# Patient Record
Sex: Male | Born: 1935 | Race: White | Hispanic: No | Smoking: Former smoker
Health system: Southern US, Community
[De-identification: ages and names within clinical notes are randomized; demographics above are authoritative.]

## PROBLEM LIST (undated history)

## (undated) DIAGNOSIS — F0390 Unspecified dementia without behavioral disturbance: Secondary | ICD-10-CM

## (undated) DIAGNOSIS — K219 Gastro-esophageal reflux disease without esophagitis: Secondary | ICD-10-CM

## (undated) DIAGNOSIS — F419 Anxiety disorder, unspecified: Secondary | ICD-10-CM

## (undated) DIAGNOSIS — E119 Type 2 diabetes mellitus without complications: Secondary | ICD-10-CM

## (undated) DIAGNOSIS — E7800 Pure hypercholesterolemia, unspecified: Secondary | ICD-10-CM

## (undated) DIAGNOSIS — J449 Chronic obstructive pulmonary disease, unspecified: Secondary | ICD-10-CM

## (undated) DIAGNOSIS — I10 Essential (primary) hypertension: Secondary | ICD-10-CM

## (undated) DIAGNOSIS — G459 Transient cerebral ischemic attack, unspecified: Secondary | ICD-10-CM

## (undated) HISTORY — PX: STOMACH SURGERY: SHX791

## (undated) HISTORY — PX: CATARACT EXTRACTION: SUR2

## (undated) HISTORY — PX: NECK SURGERY: SHX720

## (undated) HISTORY — DX: Unspecified dementia, unspecified severity, without behavioral disturbance, psychotic disturbance, mood disturbance, and anxiety: F03.90

## (undated) HISTORY — DX: Pure hypercholesterolemia, unspecified: E78.00

## (undated) HISTORY — DX: Anxiety disorder, unspecified: F41.9

## (undated) HISTORY — DX: Essential (primary) hypertension: I10

## (undated) HISTORY — DX: Chronic obstructive pulmonary disease, unspecified: J44.9

## (undated) HISTORY — DX: Gastro-esophageal reflux disease without esophagitis: K21.9

## (undated) HISTORY — DX: Transient cerebral ischemic attack, unspecified: G45.9

## (undated) HISTORY — PX: HERNIA REPAIR: SHX51

## (undated) HISTORY — DX: Type 2 diabetes mellitus without complications: E11.9

---

## 2010-05-10 ENCOUNTER — Encounter: Admission: RE | Admit: 2010-05-10 | Discharge: 2010-05-10 | Payer: Self-pay | Admitting: Neurosurgery

## 2010-05-18 ENCOUNTER — Inpatient Hospital Stay (HOSPITAL_COMMUNITY)
Admission: RE | Admit: 2010-05-18 | Discharge: 2010-05-20 | Payer: Self-pay | Source: Home / Self Care | Admitting: Neurosurgery

## 2010-06-28 ENCOUNTER — Encounter
Admission: RE | Admit: 2010-06-28 | Discharge: 2010-09-15 | Payer: Self-pay | Source: Home / Self Care | Attending: Neurosurgery | Admitting: Neurosurgery

## 2010-12-01 LAB — GLUCOSE, CAPILLARY
Glucose-Capillary: 182 mg/dL — ABNORMAL HIGH (ref 70–99)
Glucose-Capillary: 227 mg/dL — ABNORMAL HIGH (ref 70–99)
Glucose-Capillary: 308 mg/dL — ABNORMAL HIGH (ref 70–99)

## 2010-12-02 LAB — CBC
MCV: 86.8 fL (ref 78.0–100.0)
Platelets: 178 10*3/uL (ref 150–400)
RDW: 12.8 % (ref 11.5–15.5)
WBC: 6.3 10*3/uL (ref 4.0–10.5)

## 2010-12-02 LAB — BASIC METABOLIC PANEL
BUN: 10 mg/dL (ref 6–23)
Calcium: 9.4 mg/dL (ref 8.4–10.5)
Chloride: 104 mEq/L (ref 96–112)
GFR calc Af Amer: >60 mL/min (ref >60)
Potassium: 4.3 mEq/L (ref 3.5–5.1)
Sodium: 138 mEq/L (ref 135–145)

## 2010-12-02 LAB — GLUCOSE, CAPILLARY
Glucose-Capillary: 162 mg/dL — ABNORMAL HIGH (ref 70–99)
Glucose-Capillary: 170 mg/dL — ABNORMAL HIGH (ref 70–99)

## 2010-12-02 LAB — SURGICAL PCR SCREEN: Staphylococcus aureus: NEGATIVE
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
## 2012-06-28 DIAGNOSIS — R5383 Other fatigue: Secondary | ICD-10-CM

## 2012-06-28 DIAGNOSIS — R5381 Other malaise: Secondary | ICD-10-CM

## 2013-05-21 ENCOUNTER — Encounter (INDEPENDENT_AMBULATORY_CARE_PROVIDER_SITE_OTHER): Payer: Self-pay | Admitting: *Deleted

## 2013-06-18 ENCOUNTER — Telehealth (INDEPENDENT_AMBULATORY_CARE_PROVIDER_SITE_OTHER): Payer: Self-pay | Admitting: Internal Medicine

## 2013-06-18 ENCOUNTER — Ambulatory Visit (INDEPENDENT_AMBULATORY_CARE_PROVIDER_SITE_OTHER): Payer: Medicare Other | Admitting: Internal Medicine

## 2013-06-18 ENCOUNTER — Encounter (INDEPENDENT_AMBULATORY_CARE_PROVIDER_SITE_OTHER): Payer: Self-pay | Admitting: Internal Medicine

## 2013-06-18 VITALS — BP 108/60 | HR 64 | Temp 98.7°F | Ht 72.5 in | Wt 159.0 lb

## 2013-06-18 DIAGNOSIS — I10 Essential (primary) hypertension: Secondary | ICD-10-CM | POA: Insufficient documentation

## 2013-06-18 DIAGNOSIS — E1110 Type 2 diabetes mellitus with ketoacidosis without coma: Secondary | ICD-10-CM

## 2013-06-18 DIAGNOSIS — E1310 Other specified diabetes mellitus with ketoacidosis without coma: Secondary | ICD-10-CM

## 2013-06-18 DIAGNOSIS — R197 Diarrhea, unspecified: Secondary | ICD-10-CM | POA: Insufficient documentation

## 2013-06-18 DIAGNOSIS — E7800 Pure hypercholesterolemia, unspecified: Secondary | ICD-10-CM

## 2013-06-18 DIAGNOSIS — G459 Transient cerebral ischemic attack, unspecified: Secondary | ICD-10-CM

## 2013-06-18 DIAGNOSIS — R634 Abnormal weight loss: Secondary | ICD-10-CM

## 2013-06-18 DIAGNOSIS — K219 Gastro-esophageal reflux disease without esophagitis: Secondary | ICD-10-CM | POA: Insufficient documentation

## 2013-06-18 NOTE — Progress Notes (Signed)
Subjective:     Patient ID: Harry Joseph, male   DOB: 12-01-1934, 78 y.o.   MRN: [id number]  HPI Referred to our office by Dr. Gae Gallop for diarrhea. And weight loss. He has had some diarrhea. He has had diarrhea on and off for 5 yrs.   Sometimes when he eats foods lodge in his esophagus.  He says he also strangles easily. Beef and cornbread are slow to go down. Peanuts also give him trouble. He also has a lot of cramps and then he will go to the bathroom to have a BM.  Last episode of diarrhea was 2 weeks ago. He was having 2-3 stools a day.  The diarrhea may at times last a week. . He has weight loss. In 05/06/2013 his weight was 163.  Weight today 159. His acid reflux is controlled with Protonix.  Appetite is okay. Sometimes the thought of food makes him sick.  No melena or bright red rectal bleeding.  He had stomach surgery several years ago for an upper  GI bleed at [facility] in 2008 though he is not sure what they found. Per ED records, he was transferred with a diagnosis of likely ischemic bowel, renal failure, hematemesis by history.The CT scan (wo) revealed nothing specific other than some fluid in the esophagus.    EGD/ED, Colonoscopy 12/21/2009: Dr. Karilyn Cota.  Abnormal appearance to esophageal mucous at body. Some areas appeared thickened than others. Large patch of salmon colored mucosa at the distal esophagus suspicious of Barrett's.  Small hiatal hernia.  Colonoscopy: Small polyp ablated. Biopsy: Actively inflamed GE junction. No Barrett's esophagus or dysplasia identified. Esohagus, distal, biopsy: Acute esophagitis. Colon biopsy: Tubular adenoma.  04/20/2013 WBC 6.2, H and H 13.7 and 40.4, Albumin 4.0, AST 19, ALT 16, ALP 81 Stool 5-9 WBC, C-diff negative. 04/20/2013 CT abdomen-pelvis with CM; No acute abnormalities is noted.  Review of Systems Current Outpatient Prescriptions  Medication Sig Dispense Refill  . albuterol (PROVENTIL HFA;VENTOLIN HFA) 108 (90 BASE) MCG/ACT inhaler  Inhale 2 puffs into the lungs every 6 (six) hours as needed for wheezing.      Marland Kitchen amLODipine (NORVASC) 5 MG tablet Take 5 mg by mouth daily.      Marland Kitchen atorvastatin (LIPITOR) 20 MG tablet Take 20 mg by mouth daily.      . diazepam (VALIUM) 5 MG tablet Take 5 mg by mouth at bedtime as needed for anxiety.      . donepezil (ARICEPT) 10 MG tablet Take 10 mg by mouth at bedtime as needed.      . hydrOXYzine (ATARAX/VISTARIL) 25 MG tablet Take 25 mg by mouth 3 (three) times daily as needed for itching.      . levothyroxine (SYNTHROID) 75 MCG tablet Take 75 mcg by mouth daily before breakfast. 1/2 daily      . losartan (COZAAR) 100 MG tablet Take 100 mg by mouth daily.      . metformin (FORTAMET) 500 MG (OSM) 24 hr tablet Take 1,000 mg by mouth daily with breakfast.       . pantoprazole (PROTONIX) 40 MG tablet Take 40 mg by mouth daily.      . traMADol (ULTRAM) 50 MG tablet Take 50 mg by mouth every 6 (six) hours as needed for pain.       No current facility-administered medications for this visit.   Past Medical History  Diagnosis Date  . COPD (chronic obstructive pulmonary disease)   . Hypertension     all his life  .  Diabetes     x 6 yrs  . High cholesterol   . GERD (gastroesophageal reflux disease)   . TIA (transient ischemic attack)   . Dementia   . Anxiety    Past Surgical History  Procedure Laterality Date  . Hernia repair      rt side x 2  . Cataract extraction      x 2  . Stomach surgery      6 yrs ago at [facility] for bleeding at [facility].  . Neck surgery     Allergies  Allergen Reactions  . Codeine   . Penicillins        Objective:   Physical Exam  Filed Vitals:   06/18/13 1430  BP: 108/60  Pulse: 64  Temp: 98.7 F (37.1 C)  Height: 6' 0.5" (1.842 m)  Weight: 159 lb (72.122 kg)  Alert and oriented. Skin warm and dry. Oral mucosa is moist.   . Sclera anicteric, conjunctivae is pink. Thyroid not enlarged. No cervical lymphadenopathy. Lungs clear. Heart regular rate and  rhythm.  Abdomen is soft. Bowel sounds are positive. No hepatomegaly. No abdominal masses felt. No tenderness.  No edema to lower extremities       Assessment:    Chronic diarrhea of greater than 5 yrs.    Weight loss.? etiology Dysphagia. Esophageal stricture needs to be ruled out.   Negative for Barrett's esophagus.  Hx of colonic polyp. Surveillance in 2011.  Plan:    When you have diarrhea, take the Imodium BID.   I am going to give him 3 stool cards to check for blood.  EGD/ED with Dr. Karilyn Cota.

## 2013-06-18 NOTE — Patient Instructions (Addendum)
  Take Imodium twice a day when you have diarrhea.  Will get records from [facility] and I will let you know what we are planning to do.

## 2013-06-18 NOTE — Telephone Encounter (Signed)
Significant other will come by tomorrow and pick up three hemocult cards. I am going to go ahead and schedule him for an EGD/ED.

## 2013-06-18 NOTE — Telephone Encounter (Signed)
Will come by tomorrow and pick up 3 stool cards.  Will schedule an EGD/ED. I have spoken with patient.

## 2013-06-19 ENCOUNTER — Other Ambulatory Visit (INDEPENDENT_AMBULATORY_CARE_PROVIDER_SITE_OTHER): Payer: Self-pay | Admitting: *Deleted

## 2013-06-19 ENCOUNTER — Encounter (INDEPENDENT_AMBULATORY_CARE_PROVIDER_SITE_OTHER): Payer: Self-pay | Admitting: *Deleted

## 2013-06-19 DIAGNOSIS — R1310 Dysphagia, unspecified: Secondary | ICD-10-CM

## 2013-06-19 NOTE — Telephone Encounter (Signed)
EGD/ED sch'd 07/16/13, patient aware

## 2013-06-24 ENCOUNTER — Other Ambulatory Visit (INDEPENDENT_AMBULATORY_CARE_PROVIDER_SITE_OTHER): Payer: Medicare Other | Admitting: Internal Medicine

## 2013-06-24 ENCOUNTER — Telehealth (INDEPENDENT_AMBULATORY_CARE_PROVIDER_SITE_OTHER): Payer: Self-pay | Admitting: Internal Medicine

## 2013-06-24 ENCOUNTER — Encounter (INDEPENDENT_AMBULATORY_CARE_PROVIDER_SITE_OTHER): Payer: Self-pay

## 2013-06-24 DIAGNOSIS — R197 Diarrhea, unspecified: Secondary | ICD-10-CM

## 2013-06-24 NOTE — Telephone Encounter (Signed)
Stools studies from 04/21/2013 Fecal WBC 5-8 WBCm, C. Difficile negative.

## 2013-07-01 ENCOUNTER — Encounter (HOSPITAL_COMMUNITY): Payer: Self-pay | Admitting: Pharmacy Technician

## 2013-07-16 ENCOUNTER — Encounter (HOSPITAL_COMMUNITY): Payer: Self-pay | Admitting: *Deleted

## 2013-07-16 ENCOUNTER — Ambulatory Visit (HOSPITAL_COMMUNITY)
Admission: RE | Admit: 2013-07-16 | Discharge: 2013-07-16 | Disposition: A | Discharge status: Home / Self Care | Payer: Medicare Other | Source: Ambulatory Visit | Attending: Internal Medicine | Admitting: Internal Medicine

## 2013-07-16 ENCOUNTER — Encounter (HOSPITAL_COMMUNITY)
Admission: RE | Disposition: A | Discharge status: Home / Self Care | Payer: Self-pay | Source: Ambulatory Visit | Attending: Internal Medicine

## 2013-07-16 DIAGNOSIS — I10 Essential (primary) hypertension: Secondary | ICD-10-CM | POA: Insufficient documentation

## 2013-07-16 DIAGNOSIS — K219 Gastro-esophageal reflux disease without esophagitis: Secondary | ICD-10-CM | POA: Insufficient documentation

## 2013-07-16 DIAGNOSIS — K228 Other specified diseases of esophagus: Secondary | ICD-10-CM

## 2013-07-16 DIAGNOSIS — K319 Disease of stomach and duodenum, unspecified: Secondary | ICD-10-CM

## 2013-07-16 DIAGNOSIS — E119 Type 2 diabetes mellitus without complications: Secondary | ICD-10-CM | POA: Insufficient documentation

## 2013-07-16 DIAGNOSIS — R1310 Dysphagia, unspecified: Secondary | ICD-10-CM

## 2013-07-16 DIAGNOSIS — J4489 Other specified chronic obstructive pulmonary disease: Secondary | ICD-10-CM | POA: Insufficient documentation

## 2013-07-16 DIAGNOSIS — J449 Chronic obstructive pulmonary disease, unspecified: Secondary | ICD-10-CM | POA: Insufficient documentation

## 2013-07-16 DIAGNOSIS — K449 Diaphragmatic hernia without obstruction or gangrene: Secondary | ICD-10-CM | POA: Insufficient documentation

## 2013-07-16 DIAGNOSIS — R110 Nausea: Secondary | ICD-10-CM

## 2013-07-16 DIAGNOSIS — Z01812 Encounter for preprocedural laboratory examination: Secondary | ICD-10-CM | POA: Insufficient documentation

## 2013-07-16 HISTORY — PX: ESOPHAGOGASTRODUODENOSCOPY (EGD) WITH ESOPHAGEAL DILATION: SHX5812

## 2013-07-16 LAB — GLUCOSE, CAPILLARY

## 2013-07-16 SURGERY — ESOPHAGOGASTRODUODENOSCOPY (EGD) WITH ESOPHAGEAL DILATION
Anesthesia: Moderate Sedation

## 2013-07-16 MED ORDER — MIDAZOLAM HCL 5 MG/5ML IJ SOLN
INTRAMUSCULAR | Status: DC | PRN
  Administered 2013-07-16: 2 mg via INTRAVENOUS
  Administered 2013-07-16 (×2): 1 mg via INTRAVENOUS
  Administered 2013-07-16: 2 mg via INTRAVENOUS

## 2013-07-16 MED ORDER — MIDAZOLAM HCL 5 MG/5ML IJ SOLN
INTRAMUSCULAR | Status: AC
  Filled 2013-07-16: qty 10

## 2013-07-16 MED ORDER — MEPERIDINE HCL 50 MG/ML IJ SOLN
INTRAMUSCULAR | Status: AC
  Filled 2013-07-16: qty 1

## 2013-07-16 MED ORDER — BUTAMBEN-TETRACAINE-BENZOCAINE 2-2-14 % EX AERO
INHALATION_SPRAY | CUTANEOUS | Status: DC | PRN
  Administered 2013-07-16: 2 via TOPICAL

## 2013-07-16 MED ORDER — MEPERIDINE HCL 25 MG/ML IJ SOLN
INTRAMUSCULAR | Status: DC | PRN
  Administered 2013-07-16 (×2): 25 mg via INTRAVENOUS

## 2013-07-16 MED ORDER — STERILE WATER FOR IRRIGATION IR SOLN
Status: DC | PRN
  Administered 2013-07-16: 11:00:00

## 2013-07-16 MED ORDER — SODIUM CHLORIDE 0.9 % IV SOLN
INTRAVENOUS | Status: DC
  Administered 2013-07-16: 10:00:00 via INTRAVENOUS

## 2013-07-16 NOTE — Op Note (Signed)
EGD PROCEDURE REPORT  PATIENT:  Harry Joseph  MR#:  [id number] Birthdate:  1935-04-23, 78 y.o., male Endoscopist:  Dr. Malissa Hippo, MD Referred By:  Dr.  Colon Branch, MD Procedure Date: 07/16/2013  Procedure:   EGD with ED.  Indications:  Patient is 78 year old Caucasian male who has chronic GERD and now presents with intermittent dysphagia to solids.            Informed Consent:  The risks, benefits, alternatives & imponderables which include, but are not limited to, bleeding, infection, perforation, drug reaction and potential missed lesion have been reviewed.  The potential for biopsy, lesion removal, esophageal dilation, etc. have also been discussed.  Questions have been answered.  All parties agreeable.  Please see history & physical in medical record for more information.  Medications:  Demerol 50 mg IV Versed 6 mg IV Cetacaine spray topically for oropharyngeal anesthesia  Description of procedure:  The endoscope was introduced through the mouth and advanced to the second portion of the duodenum without difficulty or limitations. The mucosal surfaces were surveyed very carefully during advancement of the scope and upon withdrawal.  Findings:  Esophagus:  Mucosa of the esophagus appeared abnormal with slightly raised whitish plaques with geographitic pattern at proximal and mid esophagus. No erosions ulceration or stricture noted. Single patch of pink mucosa at distal previously documented to be esophagitis and not Barrett's.(2008). GEJ:  40 cm Hiatus:  43 cm Stomach:  Large amount of food debris noted in the proximal stomach. Stomach distended well. Part of the mucosa at gastric body that was visible was normal. Similarly mucosa at antrum angularis fundus and cardia was normal. Pyloric channel was patent. Duodenum:  Normal bulbar and post bulbar mucosa.  Therapeutic/Diagnostic Maneuvers Performed:   Esophagus dilated by passing 56 French Maloney dilator to full insertion.  As the dilator was withdrawn and the scope was passed again and small linear mucosal disruption noted at cervical esophagus suggestive of a web. Biopsy was taken from his aphasia mucosal plaques and endoscope was withdrawn.  Complications:   none  Impression: Abnormal appearance to esophageal mucosa with slightly raised plaques with [name] graphic pattern. Biopsy was taken from these areas post dilation. No evidence of ring or stricture. Small sliding hiatal hernia. Large amount of food debris in the stomach without evidence of peptic ulcer disease or pyloric stenosis. This finding may suggest diabetic gastroparesis. Esophagus dilated by passing 56 French Maloney dilator resulting in small linear mucosal disruption at cervical esophagus indicative of a web.   Recommendations:  Clear liquids today. I will be contacting patient with results of biopsy and further recommendations. Regarding the patient's diarrhea if it relapses he will give [place] a call.  [name]  07/16/2013  11:36 AM  CC: Dr. Colon Branch, MD & Dr. Bonnetta Barry ref. provider found

## 2013-07-16 NOTE — H&P (Addendum)
Harry Joseph is an 78 y.o. male.   Chief Complaint: Patient is here for EGD and ED. HPI: Patient is 56 old Caucasian male with history of chronic GERD and his heartburn is well controlled with therapy. He presents with intermittent solid food dysphagia. He also complains of intermittent diarrhea which he said for several months. Imodium helps but that he always comes back. When he has diarrhea he has a 5-6 stools during the day time and he may have one or 2 during night. He was recently seen in the office. He had 5-8 WBCs/ HPF and  C. difficile I PCR was negative. He states he has good appetite usually eats 2 meals a day. He has lost 30 pounds over the last 4-5 years. He is not sure if he has lost any weight this year. Patient's last EGD and colonoscopy was 3-1/2 years ago. Biopsy from distal esophagus was negative for Barrett's. He had single small tubular adenoma removed from his colon.  Past Medical History  Diagnosis Date  . COPD (chronic obstructive pulmonary disease)   . Hypertension     all his life  . Diabetes     x 6 yrs  . High cholesterol   . GERD (gastroesophageal reflux disease)   . TIA (transient ischemic attack)   . Dementia   . Anxiety     Past Surgical History  Procedure Laterality Date  . Hernia repair      rt side x 2  . Cataract extraction      x 2  . Stomach surgery      6 yrs ago at [facility] for bleeding at [facility].  . Neck surgery      History reviewed. No pertinent family history. Social History:  reports that he has quit smoking. His smoking use included Cigarettes. He has a 40 pack-year smoking history. He does not have any smokeless tobacco history on file. He reports that he does not drink alcohol or use illicit drugs.  Allergies:  Allergies  Allergen Reactions  . Codeine Rash  . Penicillins Rash    Medications Prior to Admission  Medication Sig Dispense Refill  . albuterol (PROVENTIL HFA;VENTOLIN HFA) 108 (90 BASE) MCG/ACT inhaler Inhale 2  puffs into the lungs every 6 (six) hours as needed for wheezing.      Marland Kitchen amLODipine (NORVASC) 5 MG tablet Take 5 mg by mouth daily.      Marland Kitchen atorvastatin (LIPITOR) 20 MG tablet Take 20 mg by mouth daily.      . diazepam (VALIUM) 5 MG tablet Take 5 mg by mouth at bedtime as needed for anxiety.      . donepezil (ARICEPT) 10 MG tablet Take 10 mg by mouth at bedtime.       . hydrOXYzine (ATARAX/VISTARIL) 25 MG tablet Take 25 mg by mouth 3 (three) times daily as needed for itching.      . levothyroxine (SYNTHROID) 75 MCG tablet Take 75 mcg by mouth daily before breakfast. 1/2 daily      . losartan (COZAAR) 100 MG tablet Take 100 mg by mouth daily.      . metformin (FORTAMET) 500 MG (OSM) 24 hr tablet Take 1,000 mg by mouth daily with breakfast.       . pantoprazole (PROTONIX) 40 MG tablet Take 40 mg by mouth daily.      . traMADol (ULTRAM) 50 MG tablet Take 50 mg by mouth every 6 (six) hours as needed for pain.  Results for orders placed during the hospital encounter of 07/16/13 (from the past 48 hour(s))  GLUCOSE, CAPILLARY     Status: None   Collection Time    07/16/13 10:04 AM      Result Value Range   Glucose-Capillary 96  70 - 99 mg/dL   Comment 1 Documented in Chart     No results found.  ROS  Blood pressure 149/86, pulse 57, temperature 97.7 F (36.5 C), temperature source Oral, resp. rate 19, height 6' 0.5" (1.842 m), weight 159 lb (72.122 kg), SpO2 98.00%. Physical Exam  Constitutional:  Pleasant well developed thin Caucasian male in NAD.  HENT:  Mouth/Throat: Oropharynx is clear and moist.  He has upper and lower dentures  Eyes: No scleral icterus.  Neck: No thyromegaly present.  Cardiovascular: Normal rate, regular rhythm and normal heart sounds.   No murmur heard. Respiratory: Effort normal and breath sounds normal.  GI: Soft. He exhibits no distension and no mass. There is no tenderness.  Musculoskeletal: He exhibits no edema.  Lymphadenopathy:    He has no  cervical adenopathy.  Neurological: He is alert.  Skin: Skin is warm and dry.     Assessment/Plan Solid food dysphagia in a patient with known chronic GERD. EGD and ED.   Shawntay Prest U 07/16/2013, 10:53 AM

## 2013-07-17 ENCOUNTER — Encounter (INDEPENDENT_AMBULATORY_CARE_PROVIDER_SITE_OTHER): Payer: Self-pay | Admitting: *Deleted

## 2013-07-17 NOTE — Telephone Encounter (Signed)
This encounter was created in error - please disregard.

## 2013-07-18 ENCOUNTER — Encounter (HOSPITAL_COMMUNITY): Payer: Self-pay | Admitting: Internal Medicine

## 2013-07-22 ENCOUNTER — Encounter (INDEPENDENT_AMBULATORY_CARE_PROVIDER_SITE_OTHER): Payer: Self-pay

## 2013-07-24 ENCOUNTER — Encounter (INDEPENDENT_AMBULATORY_CARE_PROVIDER_SITE_OTHER): Payer: Self-pay | Admitting: *Deleted

## 2014-02-10 ENCOUNTER — Encounter (INDEPENDENT_AMBULATORY_CARE_PROVIDER_SITE_OTHER): Payer: Self-pay | Admitting: *Deleted

## 2014-02-26 ENCOUNTER — Ambulatory Visit (INDEPENDENT_AMBULATORY_CARE_PROVIDER_SITE_OTHER): Payer: Medicare Other | Admitting: Internal Medicine

## 2014-02-26 ENCOUNTER — Telehealth (INDEPENDENT_AMBULATORY_CARE_PROVIDER_SITE_OTHER): Payer: Self-pay | Admitting: *Deleted

## 2014-02-26 NOTE — Telephone Encounter (Signed)
Harry Joseph was a No Show for his apt on 02/26/14 with Deberah Castle, NP.

## 2014-02-27 ENCOUNTER — Encounter (INDEPENDENT_AMBULATORY_CARE_PROVIDER_SITE_OTHER): Payer: Self-pay | Admitting: *Deleted

## 2014-06-18 ENCOUNTER — Ambulatory Visit (INDEPENDENT_AMBULATORY_CARE_PROVIDER_SITE_OTHER): Payer: Medicare Other | Admitting: Neurology

## 2014-06-18 ENCOUNTER — Encounter (INDEPENDENT_AMBULATORY_CARE_PROVIDER_SITE_OTHER): Payer: Self-pay

## 2014-06-18 ENCOUNTER — Encounter: Payer: Self-pay | Admitting: Neurology

## 2014-06-18 VITALS — BP 159/70 | HR 54 | Ht 72.5 in | Wt 169.8 lb

## 2014-06-18 DIAGNOSIS — G451 Carotid artery syndrome (hemispheric): Secondary | ICD-10-CM | POA: Insufficient documentation

## 2014-06-18 DIAGNOSIS — G3184 Mild cognitive impairment, so stated: Secondary | ICD-10-CM

## 2014-06-18 DIAGNOSIS — R479 Unspecified speech disturbances: Secondary | ICD-10-CM | POA: Insufficient documentation
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
## 2014-06-29 ENCOUNTER — Other Ambulatory Visit (INDEPENDENT_AMBULATORY_CARE_PROVIDER_SITE_OTHER): Payer: Self-pay

## 2014-06-29 ENCOUNTER — Other Ambulatory Visit: Payer: Self-pay | Admitting: Neurology

## 2014-06-29 ENCOUNTER — Other Ambulatory Visit (INDEPENDENT_AMBULATORY_CARE_PROVIDER_SITE_OTHER): Payer: Medicare Other

## 2014-06-29 DIAGNOSIS — Z0289 Encounter for other administrative examinations: Secondary | ICD-10-CM

## 2014-06-29 DIAGNOSIS — G451 Carotid artery syndrome (hemispheric): Secondary | ICD-10-CM

## 2014-06-29 DIAGNOSIS — G3184 Mild cognitive impairment, so stated: Secondary | ICD-10-CM

## 2014-06-29 LAB — BASIC METABOLIC PANEL
BUN / CREAT RATIO: 13 (ref 10–22)
BUN: 22 mg/dL (ref 8–27)
CO2: 24 mmol/L (ref 18–29)
CREATININE: 1.73 mg/dL — AB (ref 0.76–1.27)
Calcium: 8.8 mg/dL (ref 8.6–10.2)
Chloride: 104 mmol/L (ref 96–108)
GFR calc Af Amer: 43 mL/min/{1.73_m2} — ABNORMAL LOW (ref >59)
GFR calc non Af Amer: 37 mL/min/{1.73_m2} — ABNORMAL LOW (ref >59)
Glucose: 118 mg/dL — ABNORMAL HIGH (ref 65–99)
Potassium: 4.5 mmol/L (ref 3.5–5.2)
Sodium: 138 mmol/L (ref 134–144)

## 2014-06-30 LAB — LIPID PANEL
Chol/HDL Ratio: 6.7 ratio units — ABNORMAL HIGH (ref 0.0–5.0)
Cholesterol, Total: 181 mg/dL (ref 100–199)
HDL: 27 mg/dL — AB (ref >39)
LDL Calculated: 75 mg/dL (ref 0–99)
TRIGLYCERIDES: 396 mg/dL — AB (ref 0–149)
VLDL Cholesterol Cal: 79 mg/dL — ABNORMAL HIGH (ref 5–40)

## 2014-06-30 LAB — HEMOGLOBIN A1C
Est. average glucose Bld gHb Est-mCnc: 137 mg/dL
Hgb A1c MFr Bld: 6.4 % — ABNORMAL HIGH (ref 4.8–5.6)

## 2014-07-01 ENCOUNTER — Telehealth: Payer: Self-pay | Admitting: *Deleted

## 2014-07-01 ENCOUNTER — Encounter: Payer: Self-pay | Admitting: *Deleted

## 2014-07-01 NOTE — Telephone Encounter (Signed)
Lab letter mailed. 

## 2014-07-01 NOTE — Telephone Encounter (Signed)
error 

## 2014-07-01 NOTE — Telephone Encounter (Signed)
triglycerides are elevated but rest of lipids are ok. Refer to PCP to address.

## 2014-07-02 ENCOUNTER — Ambulatory Visit
Admission: RE | Admit: 2014-07-02 | Discharge: 2014-07-02 | Disposition: A | Discharge status: Home / Self Care | Payer: Medicare Other | Source: Ambulatory Visit | Attending: Neurology | Admitting: Neurology

## 2014-07-02 DIAGNOSIS — G451 Carotid artery syndrome (hemispheric): Secondary | ICD-10-CM

## 2014-07-02 DIAGNOSIS — R479 Unspecified speech disturbances: Secondary | ICD-10-CM

## 2014-07-02 MED ORDER — IOHEXOL 350 MG/ML SOLN
50.0000 mL | Freq: Once | INTRAVENOUS | Status: AC | PRN
  Administered 2014-07-02: 50 mL via INTRAVENOUS

## 2014-07-07 ENCOUNTER — Telehealth: Payer: Self-pay | Admitting: Neurology

## 2014-07-07 NOTE — Telephone Encounter (Signed)
Patient's girlfriend calling again to request results, states that patient is very anxious, please return call and advise.

## 2014-07-07 NOTE — Telephone Encounter (Signed)
Patient's girlfriend calling on behalf of patient to request test results from last week, please call and advise.

## 2014-07-08 NOTE — Telephone Encounter (Signed)
Patient calling for test results, lab values came back abnormal, please call patient.

## 2014-07-16 NOTE — Telephone Encounter (Signed)
Patient came in the office yesterday to get his results of his CT, EEG, and labs, informed patient that both CT & EEG were both normal, patient requested a copy of all his labs, CT & EEG. Patient did sign a release form.

## 2014-10-08 ENCOUNTER — Ambulatory Visit: Payer: Medicare Other | Admitting: Neurology

## 2015-09-21 DIAGNOSIS — E119 Type 2 diabetes mellitus without complications: Secondary | ICD-10-CM | POA: Diagnosis not present

## 2015-09-21 DIAGNOSIS — I10 Essential (primary) hypertension: Secondary | ICD-10-CM | POA: Diagnosis not present

## 2015-10-19 DIAGNOSIS — E7800 Pure hypercholesterolemia, unspecified: Secondary | ICD-10-CM | POA: Diagnosis not present

## 2015-10-19 DIAGNOSIS — E119 Type 2 diabetes mellitus without complications: Secondary | ICD-10-CM | POA: Diagnosis not present

## 2015-10-19 DIAGNOSIS — R5381 Other malaise: Secondary | ICD-10-CM | POA: Diagnosis not present

## 2015-10-19 DIAGNOSIS — R531 Weakness: Secondary | ICD-10-CM | POA: Diagnosis not present

## 2015-10-19 DIAGNOSIS — E039 Hypothyroidism, unspecified: Secondary | ICD-10-CM | POA: Diagnosis not present

## 2015-10-19 DIAGNOSIS — E782 Mixed hyperlipidemia: Secondary | ICD-10-CM | POA: Diagnosis not present

## 2015-10-19 DIAGNOSIS — Z79899 Other long term (current) drug therapy: Secondary | ICD-10-CM | POA: Diagnosis not present

## 2015-10-19 DIAGNOSIS — I10 Essential (primary) hypertension: Secondary | ICD-10-CM | POA: Diagnosis not present

## 2015-11-02 DIAGNOSIS — H01001 Unspecified blepharitis right upper eyelid: Secondary | ICD-10-CM | POA: Diagnosis not present

## 2015-11-02 DIAGNOSIS — H04123 Dry eye syndrome of bilateral lacrimal glands: Secondary | ICD-10-CM | POA: Diagnosis not present

## 2015-11-02 DIAGNOSIS — H01005 Unspecified blepharitis left lower eyelid: Secondary | ICD-10-CM | POA: Diagnosis not present

## 2015-11-02 DIAGNOSIS — H01004 Unspecified blepharitis left upper eyelid: Secondary | ICD-10-CM | POA: Diagnosis not present

## 2015-11-02 DIAGNOSIS — H01002 Unspecified blepharitis right lower eyelid: Secondary | ICD-10-CM | POA: Diagnosis not present

## 2015-11-04 DIAGNOSIS — I10 Essential (primary) hypertension: Secondary | ICD-10-CM | POA: Diagnosis not present

## 2015-11-04 DIAGNOSIS — E119 Type 2 diabetes mellitus without complications: Secondary | ICD-10-CM | POA: Diagnosis not present

## 2015-11-18 DIAGNOSIS — I10 Essential (primary) hypertension: Secondary | ICD-10-CM | POA: Diagnosis not present

## 2015-11-18 DIAGNOSIS — E119 Type 2 diabetes mellitus without complications: Secondary | ICD-10-CM | POA: Diagnosis not present

## 2015-12-20 DIAGNOSIS — I10 Essential (primary) hypertension: Secondary | ICD-10-CM | POA: Diagnosis not present

## 2015-12-20 DIAGNOSIS — E119 Type 2 diabetes mellitus without complications: Secondary | ICD-10-CM | POA: Diagnosis not present

## 2016-01-18 DIAGNOSIS — E7800 Pure hypercholesterolemia, unspecified: Secondary | ICD-10-CM | POA: Diagnosis not present

## 2016-01-18 DIAGNOSIS — N400 Enlarged prostate without lower urinary tract symptoms: Secondary | ICD-10-CM | POA: Diagnosis not present

## 2016-01-18 DIAGNOSIS — I10 Essential (primary) hypertension: Secondary | ICD-10-CM | POA: Diagnosis not present

## 2016-01-18 DIAGNOSIS — R531 Weakness: Secondary | ICD-10-CM | POA: Diagnosis not present

## 2016-01-18 DIAGNOSIS — Z79899 Other long term (current) drug therapy: Secondary | ICD-10-CM | POA: Diagnosis not present

## 2016-01-18 DIAGNOSIS — E119 Type 2 diabetes mellitus without complications: Secondary | ICD-10-CM | POA: Diagnosis not present

## 2016-01-18 DIAGNOSIS — R5381 Other malaise: Secondary | ICD-10-CM | POA: Diagnosis not present

## 2016-01-18 DIAGNOSIS — E782 Mixed hyperlipidemia: Secondary | ICD-10-CM | POA: Diagnosis not present

## 2016-01-18 DIAGNOSIS — E039 Hypothyroidism, unspecified: Secondary | ICD-10-CM | POA: Diagnosis not present

## 2016-01-18 DIAGNOSIS — M5416 Radiculopathy, lumbar region: Secondary | ICD-10-CM | POA: Diagnosis not present

## 2016-02-17 DIAGNOSIS — E1169 Type 2 diabetes mellitus with other specified complication: Secondary | ICD-10-CM | POA: Diagnosis not present

## 2016-02-17 DIAGNOSIS — I10 Essential (primary) hypertension: Secondary | ICD-10-CM | POA: Diagnosis not present

## 2016-02-18 DIAGNOSIS — N19 Unspecified kidney failure: Secondary | ICD-10-CM | POA: Diagnosis not present

## 2016-02-18 DIAGNOSIS — N181 Chronic kidney disease, stage 1: Secondary | ICD-10-CM | POA: Diagnosis not present

## 2016-03-20 DIAGNOSIS — S90851A Superficial foreign body, right foot, initial encounter: Secondary | ICD-10-CM | POA: Diagnosis not present

## 2016-03-20 DIAGNOSIS — E119 Type 2 diabetes mellitus without complications: Secondary | ICD-10-CM | POA: Diagnosis not present

## 2016-03-20 DIAGNOSIS — I10 Essential (primary) hypertension: Secondary | ICD-10-CM | POA: Diagnosis not present

## 2016-03-20 DIAGNOSIS — S93304A Unspecified dislocation of right foot, initial encounter: Secondary | ICD-10-CM | POA: Diagnosis not present

## 2016-04-27 DIAGNOSIS — E119 Type 2 diabetes mellitus without complications: Secondary | ICD-10-CM | POA: Diagnosis not present

## 2016-04-27 DIAGNOSIS — I10 Essential (primary) hypertension: Secondary | ICD-10-CM | POA: Diagnosis not present

## 2016-05-10 DIAGNOSIS — S199XXA Unspecified injury of neck, initial encounter: Secondary | ICD-10-CM | POA: Diagnosis not present

## 2016-05-10 DIAGNOSIS — G4489 Other headache syndrome: Secondary | ICD-10-CM | POA: Diagnosis not present

## 2016-05-10 DIAGNOSIS — M25552 Pain in left hip: Secondary | ICD-10-CM | POA: Diagnosis not present

## 2016-05-10 DIAGNOSIS — M542 Cervicalgia: Secondary | ICD-10-CM | POA: Diagnosis not present

## 2016-05-10 DIAGNOSIS — I959 Hypotension, unspecified: Secondary | ICD-10-CM | POA: Diagnosis not present

## 2016-05-10 DIAGNOSIS — Z7982 Long term (current) use of aspirin: Secondary | ICD-10-CM | POA: Diagnosis not present

## 2016-05-10 DIAGNOSIS — I10 Essential (primary) hypertension: Secondary | ICD-10-CM | POA: Diagnosis not present

## 2016-05-10 DIAGNOSIS — S0990XA Unspecified injury of head, initial encounter: Secondary | ICD-10-CM | POA: Diagnosis not present

## 2016-05-10 DIAGNOSIS — R51 Headache: Secondary | ICD-10-CM | POA: Diagnosis not present

## 2016-05-10 DIAGNOSIS — R202 Paresthesia of skin: Secondary | ICD-10-CM | POA: Diagnosis not present

## 2016-05-10 DIAGNOSIS — E119 Type 2 diabetes mellitus without complications: Secondary | ICD-10-CM | POA: Diagnosis not present

## 2016-05-10 DIAGNOSIS — Z79899 Other long term (current) drug therapy: Secondary | ICD-10-CM | POA: Diagnosis not present

## 2016-05-10 DIAGNOSIS — E7800 Pure hypercholesterolemia, unspecified: Secondary | ICD-10-CM | POA: Diagnosis not present

## 2016-05-12 DIAGNOSIS — Z79899 Other long term (current) drug therapy: Secondary | ICD-10-CM | POA: Diagnosis not present

## 2016-05-12 DIAGNOSIS — S9002XA Contusion of left ankle, initial encounter: Secondary | ICD-10-CM | POA: Diagnosis not present

## 2016-05-12 DIAGNOSIS — E119 Type 2 diabetes mellitus without complications: Secondary | ICD-10-CM | POA: Diagnosis not present

## 2016-05-12 DIAGNOSIS — S79911A Unspecified injury of right hip, initial encounter: Secondary | ICD-10-CM | POA: Diagnosis not present

## 2016-05-12 DIAGNOSIS — S134XXA Sprain of ligaments of cervical spine, initial encounter: Secondary | ICD-10-CM | POA: Diagnosis not present

## 2016-05-12 DIAGNOSIS — S79912A Unspecified injury of left hip, initial encounter: Secondary | ICD-10-CM | POA: Diagnosis not present

## 2016-05-12 DIAGNOSIS — M79605 Pain in left leg: Secondary | ICD-10-CM | POA: Diagnosis not present

## 2016-05-12 DIAGNOSIS — S138XXA Sprain of joints and ligaments of other parts of neck, initial encounter: Secondary | ICD-10-CM | POA: Diagnosis not present

## 2016-05-12 DIAGNOSIS — Z87891 Personal history of nicotine dependence: Secondary | ICD-10-CM | POA: Diagnosis not present

## 2016-05-12 DIAGNOSIS — T148 Other injury of unspecified body region: Secondary | ICD-10-CM | POA: Diagnosis not present

## 2016-05-12 DIAGNOSIS — S8992XA Unspecified injury of left lower leg, initial encounter: Secondary | ICD-10-CM | POA: Diagnosis not present

## 2016-05-12 DIAGNOSIS — M25572 Pain in left ankle and joints of left foot: Secondary | ICD-10-CM | POA: Diagnosis not present

## 2016-05-12 DIAGNOSIS — M25562 Pain in left knee: Secondary | ICD-10-CM | POA: Diagnosis not present

## 2016-05-12 DIAGNOSIS — S99912A Unspecified injury of left ankle, initial encounter: Secondary | ICD-10-CM | POA: Diagnosis not present

## 2016-05-12 DIAGNOSIS — S8002XA Contusion of left knee, initial encounter: Secondary | ICD-10-CM | POA: Diagnosis not present

## 2016-05-12 DIAGNOSIS — S199XXA Unspecified injury of neck, initial encounter: Secondary | ICD-10-CM | POA: Diagnosis not present

## 2016-05-12 DIAGNOSIS — Z7982 Long term (current) use of aspirin: Secondary | ICD-10-CM | POA: Diagnosis not present

## 2016-05-12 DIAGNOSIS — I10 Essential (primary) hypertension: Secondary | ICD-10-CM | POA: Diagnosis not present

## 2016-05-12 DIAGNOSIS — S0990XA Unspecified injury of head, initial encounter: Secondary | ICD-10-CM | POA: Diagnosis not present

## 2016-05-12 DIAGNOSIS — M25552 Pain in left hip: Secondary | ICD-10-CM | POA: Diagnosis not present

## 2016-05-12 DIAGNOSIS — M542 Cervicalgia: Secondary | ICD-10-CM | POA: Diagnosis not present

## 2016-05-25 DIAGNOSIS — E119 Type 2 diabetes mellitus without complications: Secondary | ICD-10-CM | POA: Diagnosis not present

## 2016-05-25 DIAGNOSIS — I10 Essential (primary) hypertension: Secondary | ICD-10-CM | POA: Diagnosis not present

## 2016-06-10 DIAGNOSIS — Z23 Encounter for immunization: Secondary | ICD-10-CM | POA: Diagnosis not present

## 2016-06-21 DIAGNOSIS — I10 Essential (primary) hypertension: Secondary | ICD-10-CM | POA: Diagnosis not present

## 2016-06-21 DIAGNOSIS — E119 Type 2 diabetes mellitus without complications: Secondary | ICD-10-CM | POA: Diagnosis not present

## 2016-07-20 DIAGNOSIS — R531 Weakness: Secondary | ICD-10-CM | POA: Diagnosis not present

## 2016-07-20 DIAGNOSIS — I10 Essential (primary) hypertension: Secondary | ICD-10-CM | POA: Diagnosis not present

## 2016-07-20 DIAGNOSIS — H6121 Impacted cerumen, right ear: Secondary | ICD-10-CM | POA: Diagnosis not present

## 2016-07-20 DIAGNOSIS — Z79899 Other long term (current) drug therapy: Secondary | ICD-10-CM | POA: Diagnosis not present

## 2016-07-20 DIAGNOSIS — R5381 Other malaise: Secondary | ICD-10-CM | POA: Diagnosis not present

## 2016-07-20 DIAGNOSIS — N181 Chronic kidney disease, stage 1: Secondary | ICD-10-CM | POA: Diagnosis not present

## 2016-07-20 DIAGNOSIS — N189 Chronic kidney disease, unspecified: Secondary | ICD-10-CM | POA: Diagnosis not present

## 2016-07-20 DIAGNOSIS — E119 Type 2 diabetes mellitus without complications: Secondary | ICD-10-CM | POA: Diagnosis not present

## 2016-07-27 DIAGNOSIS — M79609 Pain in unspecified limb: Secondary | ICD-10-CM | POA: Diagnosis not present

## 2016-08-21 DIAGNOSIS — I10 Essential (primary) hypertension: Secondary | ICD-10-CM | POA: Diagnosis not present

## 2016-08-21 DIAGNOSIS — E119 Type 2 diabetes mellitus without complications: Secondary | ICD-10-CM | POA: Diagnosis not present

## 2016-08-28 DIAGNOSIS — E119 Type 2 diabetes mellitus without complications: Secondary | ICD-10-CM | POA: Diagnosis not present

## 2016-08-28 DIAGNOSIS — H01022 Squamous blepharitis right lower eyelid: Secondary | ICD-10-CM | POA: Diagnosis not present

## 2016-08-28 DIAGNOSIS — Z961 Presence of intraocular lens: Secondary | ICD-10-CM | POA: Diagnosis not present

## 2016-08-28 DIAGNOSIS — H01024 Squamous blepharitis left upper eyelid: Secondary | ICD-10-CM | POA: Diagnosis not present

## 2016-08-28 DIAGNOSIS — H01021 Squamous blepharitis right upper eyelid: Secondary | ICD-10-CM | POA: Diagnosis not present

## 2016-08-28 DIAGNOSIS — H01025 Squamous blepharitis left lower eyelid: Secondary | ICD-10-CM | POA: Diagnosis not present

## 2016-08-28 DIAGNOSIS — H04123 Dry eye syndrome of bilateral lacrimal glands: Secondary | ICD-10-CM | POA: Diagnosis not present

## 2016-09-21 DIAGNOSIS — R5381 Other malaise: Secondary | ICD-10-CM | POA: Diagnosis not present

## 2016-09-21 DIAGNOSIS — E119 Type 2 diabetes mellitus without complications: Secondary | ICD-10-CM | POA: Diagnosis not present

## 2016-09-21 DIAGNOSIS — E782 Mixed hyperlipidemia: Secondary | ICD-10-CM | POA: Diagnosis not present

## 2016-09-21 DIAGNOSIS — Z79899 Other long term (current) drug therapy: Secondary | ICD-10-CM | POA: Diagnosis not present

## 2016-09-21 DIAGNOSIS — I10 Essential (primary) hypertension: Secondary | ICD-10-CM | POA: Diagnosis not present

## 2016-09-21 DIAGNOSIS — E039 Hypothyroidism, unspecified: Secondary | ICD-10-CM | POA: Diagnosis not present

## 2016-09-21 DIAGNOSIS — E7800 Pure hypercholesterolemia, unspecified: Secondary | ICD-10-CM | POA: Diagnosis not present

## 2016-09-21 DIAGNOSIS — R531 Weakness: Secondary | ICD-10-CM | POA: Diagnosis not present

## 2016-09-27 DIAGNOSIS — E119 Type 2 diabetes mellitus without complications: Secondary | ICD-10-CM | POA: Diagnosis not present

## 2016-09-27 DIAGNOSIS — R509 Fever, unspecified: Secondary | ICD-10-CM | POA: Diagnosis not present

## 2016-09-27 DIAGNOSIS — J309 Allergic rhinitis, unspecified: Secondary | ICD-10-CM | POA: Diagnosis not present

## 2016-11-17 DIAGNOSIS — F338 Other recurrent depressive disorders: Secondary | ICD-10-CM | POA: Diagnosis not present

## 2016-11-17 DIAGNOSIS — K210 Gastro-esophageal reflux disease with esophagitis: Secondary | ICD-10-CM | POA: Diagnosis not present

## 2016-11-17 DIAGNOSIS — N4289 Other specified disorders of prostate: Secondary | ICD-10-CM | POA: Diagnosis not present

## 2016-11-17 DIAGNOSIS — E038 Other specified hypothyroidism: Secondary | ICD-10-CM | POA: Diagnosis not present

## 2016-11-17 DIAGNOSIS — F0280 Dementia in other diseases classified elsewhere without behavioral disturbance: Secondary | ICD-10-CM | POA: Diagnosis not present

## 2016-11-17 DIAGNOSIS — J4530 Mild persistent asthma, uncomplicated: Secondary | ICD-10-CM | POA: Diagnosis not present

## 2016-11-17 DIAGNOSIS — E1142 Type 2 diabetes mellitus with diabetic polyneuropathy: Secondary | ICD-10-CM | POA: Diagnosis not present

## 2016-11-22 DIAGNOSIS — Z79899 Other long term (current) drug therapy: Secondary | ICD-10-CM | POA: Diagnosis not present

## 2016-11-22 DIAGNOSIS — E1142 Type 2 diabetes mellitus with diabetic polyneuropathy: Secondary | ICD-10-CM | POA: Diagnosis not present

## 2017-01-19 DIAGNOSIS — F338 Other recurrent depressive disorders: Secondary | ICD-10-CM | POA: Diagnosis not present

## 2017-01-19 DIAGNOSIS — J4530 Mild persistent asthma, uncomplicated: Secondary | ICD-10-CM | POA: Diagnosis not present

## 2017-01-19 DIAGNOSIS — F0280 Dementia in other diseases classified elsewhere without behavioral disturbance: Secondary | ICD-10-CM | POA: Diagnosis not present

## 2017-01-19 DIAGNOSIS — N4289 Other specified disorders of prostate: Secondary | ICD-10-CM | POA: Diagnosis not present

## 2017-01-19 DIAGNOSIS — E038 Other specified hypothyroidism: Secondary | ICD-10-CM | POA: Diagnosis not present

## 2017-01-19 DIAGNOSIS — E1142 Type 2 diabetes mellitus with diabetic polyneuropathy: Secondary | ICD-10-CM | POA: Diagnosis not present

## 2017-01-19 DIAGNOSIS — K210 Gastro-esophageal reflux disease with esophagitis: Secondary | ICD-10-CM | POA: Diagnosis not present

## 2017-04-02 DIAGNOSIS — N4289 Other specified disorders of prostate: Secondary | ICD-10-CM | POA: Diagnosis not present

## 2017-04-02 DIAGNOSIS — K210 Gastro-esophageal reflux disease with esophagitis: Secondary | ICD-10-CM | POA: Diagnosis not present

## 2017-04-02 DIAGNOSIS — F338 Other recurrent depressive disorders: Secondary | ICD-10-CM | POA: Diagnosis not present

## 2017-04-02 DIAGNOSIS — J4530 Mild persistent asthma, uncomplicated: Secondary | ICD-10-CM | POA: Diagnosis not present

## 2017-04-02 DIAGNOSIS — M545 Low back pain: Secondary | ICD-10-CM | POA: Diagnosis not present

## 2017-04-02 DIAGNOSIS — F411 Generalized anxiety disorder: Secondary | ICD-10-CM | POA: Diagnosis not present

## 2017-04-02 DIAGNOSIS — E1142 Type 2 diabetes mellitus with diabetic polyneuropathy: Secondary | ICD-10-CM | POA: Diagnosis not present

## 2017-04-02 DIAGNOSIS — E038 Other specified hypothyroidism: Secondary | ICD-10-CM | POA: Diagnosis not present

## 2017-05-31 DIAGNOSIS — E1142 Type 2 diabetes mellitus with diabetic polyneuropathy: Secondary | ICD-10-CM | POA: Diagnosis not present

## 2017-05-31 DIAGNOSIS — E038 Other specified hypothyroidism: Secondary | ICD-10-CM | POA: Diagnosis not present

## 2017-05-31 DIAGNOSIS — K210 Gastro-esophageal reflux disease with esophagitis: Secondary | ICD-10-CM | POA: Diagnosis not present

## 2017-05-31 DIAGNOSIS — Z79899 Other long term (current) drug therapy: Secondary | ICD-10-CM | POA: Diagnosis not present

## 2017-05-31 DIAGNOSIS — F338 Other recurrent depressive disorders: Secondary | ICD-10-CM | POA: Diagnosis not present

## 2017-05-31 DIAGNOSIS — M545 Low back pain: Secondary | ICD-10-CM | POA: Diagnosis not present

## 2017-07-13 DIAGNOSIS — Z23 Encounter for immunization: Secondary | ICD-10-CM | POA: Diagnosis not present

## 2017-07-30 DIAGNOSIS — K210 Gastro-esophageal reflux disease with esophagitis: Secondary | ICD-10-CM | POA: Diagnosis not present

## 2017-07-30 DIAGNOSIS — E038 Other specified hypothyroidism: Secondary | ICD-10-CM | POA: Diagnosis not present

## 2017-07-30 DIAGNOSIS — F338 Other recurrent depressive disorders: Secondary | ICD-10-CM | POA: Diagnosis not present

## 2017-07-30 DIAGNOSIS — E1142 Type 2 diabetes mellitus with diabetic polyneuropathy: Secondary | ICD-10-CM | POA: Diagnosis not present

## 2017-07-30 DIAGNOSIS — M545 Low back pain: Secondary | ICD-10-CM | POA: Diagnosis not present

## 2017-10-02 DIAGNOSIS — M545 Low back pain: Secondary | ICD-10-CM | POA: Diagnosis not present

## 2017-10-02 DIAGNOSIS — E038 Other specified hypothyroidism: Secondary | ICD-10-CM | POA: Diagnosis not present

## 2017-10-02 DIAGNOSIS — Z0000 Encounter for general adult medical examination without abnormal findings: Secondary | ICD-10-CM | POA: Diagnosis not present

## 2017-10-02 DIAGNOSIS — K210 Gastro-esophageal reflux disease with esophagitis: Secondary | ICD-10-CM | POA: Diagnosis not present

## 2017-10-02 DIAGNOSIS — F338 Other recurrent depressive disorders: Secondary | ICD-10-CM | POA: Diagnosis not present

## 2017-10-02 DIAGNOSIS — E1142 Type 2 diabetes mellitus with diabetic polyneuropathy: Secondary | ICD-10-CM | POA: Diagnosis not present

## 2017-10-02 DIAGNOSIS — I9581 Postprocedural hypotension: Secondary | ICD-10-CM | POA: Diagnosis not present

## 2017-12-03 DIAGNOSIS — M545 Low back pain: Secondary | ICD-10-CM | POA: Diagnosis not present

## 2017-12-03 DIAGNOSIS — K210 Gastro-esophageal reflux disease with esophagitis: Secondary | ICD-10-CM | POA: Diagnosis not present

## 2017-12-03 DIAGNOSIS — F338 Other recurrent depressive disorders: Secondary | ICD-10-CM | POA: Diagnosis not present

## 2017-12-03 DIAGNOSIS — E1142 Type 2 diabetes mellitus with diabetic polyneuropathy: Secondary | ICD-10-CM | POA: Diagnosis not present

## 2017-12-03 DIAGNOSIS — E038 Other specified hypothyroidism: Secondary | ICD-10-CM | POA: Diagnosis not present

## 2017-12-12 DIAGNOSIS — R4702 Dysphasia: Secondary | ICD-10-CM | POA: Diagnosis not present

## 2017-12-12 DIAGNOSIS — R634 Abnormal weight loss: Secondary | ICD-10-CM | POA: Diagnosis not present

## 2017-12-12 DIAGNOSIS — R1013 Epigastric pain: Secondary | ICD-10-CM | POA: Diagnosis not present

## 2017-12-17 DIAGNOSIS — E1142 Type 2 diabetes mellitus with diabetic polyneuropathy: Secondary | ICD-10-CM | POA: Diagnosis not present

## 2017-12-17 DIAGNOSIS — I10 Essential (primary) hypertension: Secondary | ICD-10-CM | POA: Diagnosis not present

## 2017-12-17 DIAGNOSIS — Z86010 Personal history of colonic polyps: Secondary | ICD-10-CM | POA: Diagnosis not present

## 2017-12-17 DIAGNOSIS — E7800 Pure hypercholesterolemia, unspecified: Secondary | ICD-10-CM | POA: Diagnosis not present

## 2017-12-17 DIAGNOSIS — I456 Pre-excitation syndrome: Secondary | ICD-10-CM | POA: Diagnosis not present

## 2017-12-17 DIAGNOSIS — R1013 Epigastric pain: Secondary | ICD-10-CM | POA: Diagnosis not present

## 2017-12-17 DIAGNOSIS — Z79899 Other long term (current) drug therapy: Secondary | ICD-10-CM | POA: Diagnosis not present

## 2017-12-17 DIAGNOSIS — R109 Unspecified abdominal pain: Secondary | ICD-10-CM | POA: Diagnosis not present

## 2017-12-17 DIAGNOSIS — Z886 Allergy status to analgesic agent status: Secondary | ICD-10-CM | POA: Diagnosis not present

## 2017-12-17 DIAGNOSIS — Z880 Allergy status to penicillin: Secondary | ICD-10-CM | POA: Diagnosis not present

## 2017-12-17 DIAGNOSIS — Z7984 Long term (current) use of oral hypoglycemic drugs: Secondary | ICD-10-CM | POA: Diagnosis not present

## 2017-12-17 DIAGNOSIS — E039 Hypothyroidism, unspecified: Secondary | ICD-10-CM | POA: Diagnosis not present

## 2017-12-17 DIAGNOSIS — R634 Abnormal weight loss: Secondary | ICD-10-CM | POA: Diagnosis not present

## 2017-12-17 DIAGNOSIS — R4702 Dysphasia: Secondary | ICD-10-CM | POA: Diagnosis not present

## 2017-12-17 DIAGNOSIS — J45909 Unspecified asthma, uncomplicated: Secondary | ICD-10-CM | POA: Diagnosis not present

## 2017-12-17 DIAGNOSIS — Z883 Allergy status to other anti-infective agents status: Secondary | ICD-10-CM | POA: Diagnosis not present

## 2017-12-17 DIAGNOSIS — K219 Gastro-esophageal reflux disease without esophagitis: Secondary | ICD-10-CM | POA: Diagnosis not present

## 2017-12-17 DIAGNOSIS — R1310 Dysphagia, unspecified: Secondary | ICD-10-CM | POA: Diagnosis not present

## 2017-12-17 DIAGNOSIS — Z8673 Personal history of transient ischemic attack (TIA), and cerebral infarction without residual deficits: Secondary | ICD-10-CM | POA: Diagnosis not present

## 2017-12-18 DIAGNOSIS — E1142 Type 2 diabetes mellitus with diabetic polyneuropathy: Secondary | ICD-10-CM | POA: Diagnosis not present

## 2017-12-18 DIAGNOSIS — Z86010 Personal history of colonic polyps: Secondary | ICD-10-CM | POA: Diagnosis not present

## 2017-12-18 DIAGNOSIS — R1310 Dysphagia, unspecified: Secondary | ICD-10-CM | POA: Diagnosis not present

## 2017-12-18 DIAGNOSIS — K219 Gastro-esophageal reflux disease without esophagitis: Secondary | ICD-10-CM | POA: Diagnosis not present

## 2017-12-18 DIAGNOSIS — R1013 Epigastric pain: Secondary | ICD-10-CM | POA: Diagnosis not present

## 2017-12-18 DIAGNOSIS — R4702 Dysphasia: Secondary | ICD-10-CM | POA: Diagnosis not present

## 2018-01-02 DIAGNOSIS — R4702 Dysphasia: Secondary | ICD-10-CM | POA: Diagnosis not present

## 2018-01-02 DIAGNOSIS — Z1211 Encounter for screening for malignant neoplasm of colon: Secondary | ICD-10-CM | POA: Diagnosis not present

## 2018-01-17 DIAGNOSIS — G629 Polyneuropathy, unspecified: Secondary | ICD-10-CM | POA: Diagnosis not present

## 2018-01-17 DIAGNOSIS — E119 Type 2 diabetes mellitus without complications: Secondary | ICD-10-CM | POA: Diagnosis not present

## 2018-01-17 DIAGNOSIS — Z8673 Personal history of transient ischemic attack (TIA), and cerebral infarction without residual deficits: Secondary | ICD-10-CM | POA: Diagnosis not present

## 2018-01-17 DIAGNOSIS — K6389 Other specified diseases of intestine: Secondary | ICD-10-CM | POA: Diagnosis not present

## 2018-01-17 DIAGNOSIS — F419 Anxiety disorder, unspecified: Secondary | ICD-10-CM | POA: Diagnosis not present

## 2018-01-17 DIAGNOSIS — E7800 Pure hypercholesterolemia, unspecified: Secondary | ICD-10-CM | POA: Diagnosis not present

## 2018-01-17 DIAGNOSIS — I10 Essential (primary) hypertension: Secondary | ICD-10-CM | POA: Diagnosis not present

## 2018-01-17 DIAGNOSIS — E039 Hypothyroidism, unspecified: Secondary | ICD-10-CM | POA: Diagnosis not present

## 2018-01-17 DIAGNOSIS — Z6822 Body mass index (BMI) 22.0-22.9, adult: Secondary | ICD-10-CM | POA: Diagnosis not present

## 2018-01-17 DIAGNOSIS — K219 Gastro-esophageal reflux disease without esophagitis: Secondary | ICD-10-CM | POA: Diagnosis not present

## 2018-01-17 DIAGNOSIS — Z886 Allergy status to analgesic agent status: Secondary | ICD-10-CM | POA: Diagnosis not present

## 2018-01-17 DIAGNOSIS — Z888 Allergy status to other drugs, medicaments and biological substances status: Secondary | ICD-10-CM | POA: Diagnosis not present

## 2018-01-17 DIAGNOSIS — Z86010 Personal history of colonic polyps: Secondary | ICD-10-CM | POA: Diagnosis not present

## 2018-01-17 DIAGNOSIS — C183 Malignant neoplasm of hepatic flexure: Secondary | ICD-10-CM | POA: Diagnosis not present

## 2018-01-17 DIAGNOSIS — Z880 Allergy status to penicillin: Secondary | ICD-10-CM | POA: Diagnosis not present

## 2018-01-17 DIAGNOSIS — Z1211 Encounter for screening for malignant neoplasm of colon: Secondary | ICD-10-CM | POA: Diagnosis not present

## 2018-01-17 DIAGNOSIS — R634 Abnormal weight loss: Secondary | ICD-10-CM | POA: Diagnosis not present

## 2018-01-17 DIAGNOSIS — J45909 Unspecified asthma, uncomplicated: Secondary | ICD-10-CM | POA: Diagnosis not present

## 2018-01-17 DIAGNOSIS — I456 Pre-excitation syndrome: Secondary | ICD-10-CM | POA: Diagnosis not present

## 2018-01-17 DIAGNOSIS — Z883 Allergy status to other anti-infective agents status: Secondary | ICD-10-CM | POA: Diagnosis not present

## 2018-01-17 DIAGNOSIS — K625 Hemorrhage of anus and rectum: Secondary | ICD-10-CM | POA: Diagnosis not present

## 2018-01-17 DIAGNOSIS — D374 Neoplasm of uncertain behavior of colon: Secondary | ICD-10-CM | POA: Diagnosis not present

## 2018-01-22 DIAGNOSIS — C184 Malignant neoplasm of transverse colon: Secondary | ICD-10-CM | POA: Diagnosis not present

## 2018-01-23 DIAGNOSIS — I700 Atherosclerosis of aorta: Secondary | ICD-10-CM | POA: Diagnosis not present

## 2018-01-25 DIAGNOSIS — E039 Hypothyroidism, unspecified: Secondary | ICD-10-CM | POA: Diagnosis present

## 2018-01-25 DIAGNOSIS — K668 Other specified disorders of peritoneum: Secondary | ICD-10-CM | POA: Diagnosis not present

## 2018-01-25 DIAGNOSIS — F0391 Unspecified dementia with behavioral disturbance: Secondary | ICD-10-CM | POA: Diagnosis not present

## 2018-01-25 DIAGNOSIS — K219 Gastro-esophageal reflux disease without esophagitis: Secondary | ICD-10-CM | POA: Diagnosis present

## 2018-01-25 DIAGNOSIS — K639 Disease of intestine, unspecified: Secondary | ICD-10-CM | POA: Diagnosis not present

## 2018-01-25 DIAGNOSIS — Z7984 Long term (current) use of oral hypoglycemic drugs: Secondary | ICD-10-CM | POA: Diagnosis not present

## 2018-01-25 DIAGNOSIS — N2889 Other specified disorders of kidney and ureter: Secondary | ICD-10-CM | POA: Diagnosis not present

## 2018-01-25 DIAGNOSIS — F05 Delirium due to known physiological condition: Secondary | ICD-10-CM | POA: Diagnosis not present

## 2018-01-25 DIAGNOSIS — J449 Chronic obstructive pulmonary disease, unspecified: Secondary | ICD-10-CM | POA: Diagnosis present

## 2018-01-25 DIAGNOSIS — N289 Disorder of kidney and ureter, unspecified: Secondary | ICD-10-CM | POA: Diagnosis present

## 2018-01-25 DIAGNOSIS — K567 Ileus, unspecified: Secondary | ICD-10-CM | POA: Diagnosis not present

## 2018-01-25 DIAGNOSIS — Z87891 Personal history of nicotine dependence: Secondary | ICD-10-CM | POA: Diagnosis not present

## 2018-01-25 DIAGNOSIS — N400 Enlarged prostate without lower urinary tract symptoms: Secondary | ICD-10-CM | POA: Diagnosis present

## 2018-01-25 DIAGNOSIS — Z79899 Other long term (current) drug therapy: Secondary | ICD-10-CM | POA: Diagnosis not present

## 2018-01-25 DIAGNOSIS — E1142 Type 2 diabetes mellitus with diabetic polyneuropathy: Secondary | ICD-10-CM | POA: Diagnosis present

## 2018-01-25 DIAGNOSIS — C184 Malignant neoplasm of transverse colon: Secondary | ICD-10-CM | POA: Diagnosis present

## 2018-01-25 DIAGNOSIS — F0280 Dementia in other diseases classified elsewhere without behavioral disturbance: Secondary | ICD-10-CM | POA: Diagnosis not present

## 2018-01-25 DIAGNOSIS — K660 Peritoneal adhesions (postprocedural) (postinfection): Secondary | ICD-10-CM | POA: Diagnosis present

## 2018-01-25 DIAGNOSIS — C189 Malignant neoplasm of colon, unspecified: Secondary | ICD-10-CM | POA: Diagnosis not present

## 2018-01-25 DIAGNOSIS — D649 Anemia, unspecified: Secondary | ICD-10-CM | POA: Diagnosis not present

## 2018-01-25 DIAGNOSIS — Z7982 Long term (current) use of aspirin: Secondary | ICD-10-CM | POA: Diagnosis not present

## 2018-01-25 DIAGNOSIS — K5641 Fecal impaction: Secondary | ICD-10-CM | POA: Diagnosis not present

## 2018-01-25 DIAGNOSIS — F411 Generalized anxiety disorder: Secondary | ICD-10-CM | POA: Diagnosis present

## 2018-02-01 DIAGNOSIS — F0280 Dementia in other diseases classified elsewhere without behavioral disturbance: Secondary | ICD-10-CM | POA: Diagnosis not present

## 2018-02-01 DIAGNOSIS — R4182 Altered mental status, unspecified: Secondary | ICD-10-CM | POA: Diagnosis not present

## 2018-02-01 DIAGNOSIS — Z980 Intestinal bypass and anastomosis status: Secondary | ICD-10-CM | POA: Diagnosis not present

## 2018-02-01 DIAGNOSIS — R4189 Other symptoms and signs involving cognitive functions and awareness: Secondary | ICD-10-CM | POA: Diagnosis not present

## 2018-02-01 DIAGNOSIS — F0391 Unspecified dementia with behavioral disturbance: Secondary | ICD-10-CM | POA: Diagnosis not present

## 2018-02-01 DIAGNOSIS — E860 Dehydration: Secondary | ICD-10-CM | POA: Diagnosis not present

## 2018-02-01 DIAGNOSIS — R531 Weakness: Secondary | ICD-10-CM | POA: Diagnosis not present

## 2018-02-01 DIAGNOSIS — N2889 Other specified disorders of kidney and ureter: Secondary | ICD-10-CM | POA: Diagnosis not present

## 2018-02-01 DIAGNOSIS — N179 Acute kidney failure, unspecified: Secondary | ICD-10-CM | POA: Diagnosis not present

## 2018-02-01 DIAGNOSIS — E1122 Type 2 diabetes mellitus with diabetic chronic kidney disease: Secondary | ICD-10-CM | POA: Diagnosis not present

## 2018-02-01 DIAGNOSIS — R410 Disorientation, unspecified: Secondary | ICD-10-CM | POA: Diagnosis not present

## 2018-02-01 DIAGNOSIS — N289 Disorder of kidney and ureter, unspecified: Secondary | ICD-10-CM | POA: Diagnosis not present

## 2018-02-02 DIAGNOSIS — N183 Chronic kidney disease, stage 3 (moderate): Secondary | ICD-10-CM | POA: Diagnosis present

## 2018-02-02 DIAGNOSIS — Z87891 Personal history of nicotine dependence: Secondary | ICD-10-CM | POA: Diagnosis not present

## 2018-02-02 DIAGNOSIS — E1122 Type 2 diabetes mellitus with diabetic chronic kidney disease: Secondary | ICD-10-CM | POA: Diagnosis present

## 2018-02-02 DIAGNOSIS — Z881 Allergy status to other antibiotic agents status: Secondary | ICD-10-CM | POA: Diagnosis not present

## 2018-02-02 DIAGNOSIS — Z7984 Long term (current) use of oral hypoglycemic drugs: Secondary | ICD-10-CM | POA: Diagnosis not present

## 2018-02-02 DIAGNOSIS — J449 Chronic obstructive pulmonary disease, unspecified: Secondary | ICD-10-CM | POA: Diagnosis present

## 2018-02-02 DIAGNOSIS — E039 Hypothyroidism, unspecified: Secondary | ICD-10-CM | POA: Diagnosis present

## 2018-02-02 DIAGNOSIS — R4182 Altered mental status, unspecified: Secondary | ICD-10-CM | POA: Diagnosis present

## 2018-02-02 DIAGNOSIS — N400 Enlarged prostate without lower urinary tract symptoms: Secondary | ICD-10-CM | POA: Diagnosis present

## 2018-02-02 DIAGNOSIS — Z79899 Other long term (current) drug therapy: Secondary | ICD-10-CM | POA: Diagnosis not present

## 2018-02-02 DIAGNOSIS — F0391 Unspecified dementia with behavioral disturbance: Secondary | ICD-10-CM | POA: Diagnosis present

## 2018-02-02 DIAGNOSIS — K219 Gastro-esophageal reflux disease without esophagitis: Secondary | ICD-10-CM | POA: Diagnosis present

## 2018-02-02 DIAGNOSIS — F411 Generalized anxiety disorder: Secondary | ICD-10-CM | POA: Diagnosis present

## 2018-02-02 DIAGNOSIS — Z980 Intestinal bypass and anastomosis status: Secondary | ICD-10-CM | POA: Diagnosis not present

## 2018-02-02 DIAGNOSIS — Z888 Allergy status to other drugs, medicaments and biological substances status: Secondary | ICD-10-CM | POA: Diagnosis not present

## 2018-02-02 DIAGNOSIS — E860 Dehydration: Secondary | ICD-10-CM | POA: Diagnosis present

## 2018-02-02 DIAGNOSIS — F0280 Dementia in other diseases classified elsewhere without behavioral disturbance: Secondary | ICD-10-CM | POA: Diagnosis not present

## 2018-02-02 DIAGNOSIS — Z885 Allergy status to narcotic agent status: Secondary | ICD-10-CM | POA: Diagnosis not present

## 2018-02-02 DIAGNOSIS — N289 Disorder of kidney and ureter, unspecified: Secondary | ICD-10-CM | POA: Diagnosis present

## 2018-02-02 DIAGNOSIS — N2889 Other specified disorders of kidney and ureter: Secondary | ICD-10-CM | POA: Diagnosis not present

## 2018-02-02 DIAGNOSIS — Z7982 Long term (current) use of aspirin: Secondary | ICD-10-CM | POA: Diagnosis not present

## 2018-02-02 DIAGNOSIS — R4189 Other symptoms and signs involving cognitive functions and awareness: Secondary | ICD-10-CM | POA: Diagnosis not present

## 2018-02-13 DIAGNOSIS — F05 Delirium due to known physiological condition: Secondary | ICD-10-CM | POA: Diagnosis not present

## 2018-02-13 DIAGNOSIS — R1319 Other dysphagia: Secondary | ICD-10-CM | POA: Diagnosis not present

## 2018-02-17 DIAGNOSIS — R2689 Other abnormalities of gait and mobility: Secondary | ICD-10-CM | POA: Diagnosis not present

## 2018-02-17 DIAGNOSIS — Z880 Allergy status to penicillin: Secondary | ICD-10-CM | POA: Diagnosis not present

## 2018-02-17 DIAGNOSIS — Z85038 Personal history of other malignant neoplasm of large intestine: Secondary | ICD-10-CM | POA: Diagnosis not present

## 2018-02-17 DIAGNOSIS — R64 Cachexia: Secondary | ICD-10-CM | POA: Diagnosis present

## 2018-02-17 DIAGNOSIS — F419 Anxiety disorder, unspecified: Secondary | ICD-10-CM | POA: Diagnosis not present

## 2018-02-17 DIAGNOSIS — F0390 Unspecified dementia without behavioral disturbance: Secondary | ICD-10-CM | POA: Diagnosis present

## 2018-02-17 DIAGNOSIS — N400 Enlarged prostate without lower urinary tract symptoms: Secondary | ICD-10-CM | POA: Diagnosis present

## 2018-02-17 DIAGNOSIS — G9341 Metabolic encephalopathy: Secondary | ICD-10-CM | POA: Diagnosis present

## 2018-02-17 DIAGNOSIS — M6281 Muscle weakness (generalized): Secondary | ICD-10-CM | POA: Diagnosis not present

## 2018-02-17 DIAGNOSIS — Z8673 Personal history of transient ischemic attack (TIA), and cerebral infarction without residual deficits: Secondary | ICD-10-CM | POA: Diagnosis not present

## 2018-02-17 DIAGNOSIS — H9190 Unspecified hearing loss, unspecified ear: Secondary | ICD-10-CM | POA: Diagnosis present

## 2018-02-17 DIAGNOSIS — K219 Gastro-esophageal reflux disease without esophagitis: Secondary | ICD-10-CM | POA: Diagnosis present

## 2018-02-17 DIAGNOSIS — Z980 Intestinal bypass and anastomosis status: Secondary | ICD-10-CM | POA: Diagnosis not present

## 2018-02-17 DIAGNOSIS — F411 Generalized anxiety disorder: Secondary | ICD-10-CM | POA: Diagnosis present

## 2018-02-17 DIAGNOSIS — R0902 Hypoxemia: Secondary | ICD-10-CM | POA: Diagnosis not present

## 2018-02-17 DIAGNOSIS — J449 Chronic obstructive pulmonary disease, unspecified: Secondary | ICD-10-CM | POA: Diagnosis present

## 2018-02-17 DIAGNOSIS — E1140 Type 2 diabetes mellitus with diabetic neuropathy, unspecified: Secondary | ICD-10-CM | POA: Diagnosis present

## 2018-02-17 DIAGNOSIS — R1312 Dysphagia, oropharyngeal phase: Secondary | ICD-10-CM | POA: Diagnosis not present

## 2018-02-17 DIAGNOSIS — Z931 Gastrostomy status: Secondary | ICD-10-CM | POA: Diagnosis not present

## 2018-02-17 DIAGNOSIS — J690 Pneumonitis due to inhalation of food and vomit: Secondary | ICD-10-CM | POA: Diagnosis not present

## 2018-02-17 DIAGNOSIS — R05 Cough: Secondary | ICD-10-CM | POA: Diagnosis not present

## 2018-02-17 DIAGNOSIS — R1319 Other dysphagia: Secondary | ICD-10-CM | POA: Diagnosis not present

## 2018-02-17 DIAGNOSIS — E860 Dehydration: Secondary | ICD-10-CM | POA: Diagnosis not present

## 2018-02-17 DIAGNOSIS — I129 Hypertensive chronic kidney disease with stage 1 through stage 4 chronic kidney disease, or unspecified chronic kidney disease: Secondary | ICD-10-CM | POA: Diagnosis present

## 2018-02-17 DIAGNOSIS — R4182 Altered mental status, unspecified: Secondary | ICD-10-CM | POA: Diagnosis not present

## 2018-02-17 DIAGNOSIS — Z87891 Personal history of nicotine dependence: Secondary | ICD-10-CM | POA: Diagnosis not present

## 2018-02-17 DIAGNOSIS — E039 Hypothyroidism, unspecified: Secondary | ICD-10-CM | POA: Diagnosis present

## 2018-02-17 DIAGNOSIS — E440 Moderate protein-calorie malnutrition: Secondary | ICD-10-CM | POA: Diagnosis present

## 2018-02-17 DIAGNOSIS — R1310 Dysphagia, unspecified: Secondary | ICD-10-CM | POA: Diagnosis present

## 2018-02-17 DIAGNOSIS — N183 Chronic kidney disease, stage 3 (moderate): Secondary | ICD-10-CM | POA: Diagnosis present

## 2018-02-17 DIAGNOSIS — I456 Pre-excitation syndrome: Secondary | ICD-10-CM | POA: Diagnosis present

## 2018-02-17 DIAGNOSIS — E46 Unspecified protein-calorie malnutrition: Secondary | ICD-10-CM | POA: Diagnosis not present

## 2018-02-17 DIAGNOSIS — E1122 Type 2 diabetes mellitus with diabetic chronic kidney disease: Secondary | ICD-10-CM | POA: Diagnosis present

## 2018-02-17 DIAGNOSIS — F329 Major depressive disorder, single episode, unspecified: Secondary | ICD-10-CM | POA: Diagnosis present

## 2018-02-17 DIAGNOSIS — Z7984 Long term (current) use of oral hypoglycemic drugs: Secondary | ICD-10-CM | POA: Diagnosis not present

## 2018-02-25 DIAGNOSIS — E039 Hypothyroidism, unspecified: Secondary | ICD-10-CM | POA: Diagnosis not present

## 2018-02-25 DIAGNOSIS — G9341 Metabolic encephalopathy: Secondary | ICD-10-CM | POA: Diagnosis not present

## 2018-02-25 DIAGNOSIS — R1312 Dysphagia, oropharyngeal phase: Secondary | ICD-10-CM | POA: Diagnosis not present

## 2018-02-25 DIAGNOSIS — J449 Chronic obstructive pulmonary disease, unspecified: Secondary | ICD-10-CM | POA: Diagnosis not present

## 2018-02-25 DIAGNOSIS — F329 Major depressive disorder, single episode, unspecified: Secondary | ICD-10-CM | POA: Diagnosis not present

## 2018-02-25 DIAGNOSIS — J690 Pneumonitis due to inhalation of food and vomit: Secondary | ICD-10-CM | POA: Diagnosis not present

## 2018-02-25 DIAGNOSIS — C184 Malignant neoplasm of transverse colon: Secondary | ICD-10-CM | POA: Diagnosis not present

## 2018-02-25 DIAGNOSIS — J168 Pneumonia due to other specified infectious organisms: Secondary | ICD-10-CM | POA: Diagnosis not present

## 2018-02-25 DIAGNOSIS — F419 Anxiety disorder, unspecified: Secondary | ICD-10-CM | POA: Diagnosis not present

## 2018-02-25 DIAGNOSIS — R1319 Other dysphagia: Secondary | ICD-10-CM | POA: Diagnosis not present

## 2018-02-25 DIAGNOSIS — E1122 Type 2 diabetes mellitus with diabetic chronic kidney disease: Secondary | ICD-10-CM | POA: Diagnosis not present

## 2018-02-25 DIAGNOSIS — F0390 Unspecified dementia without behavioral disturbance: Secondary | ICD-10-CM | POA: Diagnosis not present

## 2018-02-25 DIAGNOSIS — N400 Enlarged prostate without lower urinary tract symptoms: Secondary | ICD-10-CM | POA: Diagnosis not present

## 2018-02-25 DIAGNOSIS — F0280 Dementia in other diseases classified elsewhere without behavioral disturbance: Secondary | ICD-10-CM | POA: Diagnosis not present

## 2018-02-25 DIAGNOSIS — R1311 Dysphagia, oral phase: Secondary | ICD-10-CM | POA: Diagnosis not present

## 2018-02-25 DIAGNOSIS — Z931 Gastrostomy status: Secondary | ICD-10-CM | POA: Diagnosis not present

## 2018-02-25 DIAGNOSIS — N183 Chronic kidney disease, stage 3 (moderate): Secondary | ICD-10-CM | POA: Diagnosis not present

## 2018-02-25 DIAGNOSIS — R64 Cachexia: Secondary | ICD-10-CM | POA: Diagnosis not present

## 2018-02-25 DIAGNOSIS — K219 Gastro-esophageal reflux disease without esophagitis: Secondary | ICD-10-CM | POA: Diagnosis not present

## 2018-02-25 DIAGNOSIS — R4182 Altered mental status, unspecified: Secondary | ICD-10-CM | POA: Diagnosis not present

## 2018-02-25 DIAGNOSIS — Z980 Intestinal bypass and anastomosis status: Secondary | ICD-10-CM | POA: Diagnosis not present

## 2018-02-25 DIAGNOSIS — R2689 Other abnormalities of gait and mobility: Secondary | ICD-10-CM | POA: Diagnosis not present

## 2018-02-25 DIAGNOSIS — E038 Other specified hypothyroidism: Secondary | ICD-10-CM | POA: Diagnosis not present

## 2018-02-25 DIAGNOSIS — M6281 Muscle weakness (generalized): Secondary | ICD-10-CM | POA: Diagnosis not present

## 2018-02-25 DIAGNOSIS — E440 Moderate protein-calorie malnutrition: Secondary | ICD-10-CM | POA: Diagnosis not present

## 2018-02-25 DIAGNOSIS — R1310 Dysphagia, unspecified: Secondary | ICD-10-CM | POA: Diagnosis not present

## 2018-02-25 DIAGNOSIS — E46 Unspecified protein-calorie malnutrition: Secondary | ICD-10-CM | POA: Diagnosis not present

## 2018-02-26 DIAGNOSIS — R1319 Other dysphagia: Secondary | ICD-10-CM | POA: Diagnosis not present

## 2018-02-26 DIAGNOSIS — F0280 Dementia in other diseases classified elsewhere without behavioral disturbance: Secondary | ICD-10-CM | POA: Diagnosis not present

## 2018-02-26 DIAGNOSIS — J168 Pneumonia due to other specified infectious organisms: Secondary | ICD-10-CM | POA: Diagnosis not present

## 2018-02-26 DIAGNOSIS — E038 Other specified hypothyroidism: Secondary | ICD-10-CM | POA: Diagnosis not present

## 2018-03-12 DIAGNOSIS — F0390 Unspecified dementia without behavioral disturbance: Secondary | ICD-10-CM | POA: Diagnosis not present

## 2018-03-12 DIAGNOSIS — R64 Cachexia: Secondary | ICD-10-CM | POA: Diagnosis not present

## 2018-03-12 DIAGNOSIS — R1311 Dysphagia, oral phase: Secondary | ICD-10-CM | POA: Diagnosis not present

## 2018-03-12 DIAGNOSIS — C184 Malignant neoplasm of transverse colon: Secondary | ICD-10-CM | POA: Diagnosis not present

## 2018-04-06 DIAGNOSIS — R1319 Other dysphagia: Secondary | ICD-10-CM | POA: Diagnosis not present

## 2018-04-06 DIAGNOSIS — F0280 Dementia in other diseases classified elsewhere without behavioral disturbance: Secondary | ICD-10-CM | POA: Diagnosis not present

## 2018-04-06 DIAGNOSIS — E038 Other specified hypothyroidism: Secondary | ICD-10-CM | POA: Diagnosis not present

## 2018-04-06 DIAGNOSIS — J168 Pneumonia due to other specified infectious organisms: Secondary | ICD-10-CM | POA: Diagnosis not present

## 2018-04-11 DIAGNOSIS — F0280 Dementia in other diseases classified elsewhere without behavioral disturbance: Secondary | ICD-10-CM | POA: Diagnosis not present

## 2018-04-11 DIAGNOSIS — K210 Gastro-esophageal reflux disease with esophagitis: Secondary | ICD-10-CM | POA: Diagnosis not present

## 2018-04-11 DIAGNOSIS — F05 Delirium due to known physiological condition: Secondary | ICD-10-CM | POA: Diagnosis not present

## 2018-07-30 DIAGNOSIS — E119 Type 2 diabetes mellitus without complications: Secondary | ICD-10-CM | POA: Diagnosis not present

## 2018-07-30 DIAGNOSIS — F0280 Dementia in other diseases classified elsewhere without behavioral disturbance: Secondary | ICD-10-CM | POA: Diagnosis not present

## 2018-07-30 DIAGNOSIS — F3289 Other specified depressive episodes: Secondary | ICD-10-CM | POA: Diagnosis not present

## 2018-07-30 DIAGNOSIS — K210 Gastro-esophageal reflux disease with esophagitis: Secondary | ICD-10-CM | POA: Diagnosis not present

## 2018-11-14 DIAGNOSIS — Z23 Encounter for immunization: Secondary | ICD-10-CM | POA: Diagnosis not present

## 2018-12-18 DIAGNOSIS — Z1389 Encounter for screening for other disorder: Secondary | ICD-10-CM | POA: Diagnosis not present

## 2018-12-18 DIAGNOSIS — F3289 Other specified depressive episodes: Secondary | ICD-10-CM | POA: Diagnosis not present

## 2018-12-18 DIAGNOSIS — Z0000 Encounter for general adult medical examination without abnormal findings: Secondary | ICD-10-CM | POA: Diagnosis not present

## 2018-12-18 DIAGNOSIS — N182 Chronic kidney disease, stage 2 (mild): Secondary | ICD-10-CM | POA: Diagnosis not present

## 2018-12-18 DIAGNOSIS — F0280 Dementia in other diseases classified elsewhere without behavioral disturbance: Secondary | ICD-10-CM | POA: Diagnosis not present

## 2018-12-18 DIAGNOSIS — E1121 Type 2 diabetes mellitus with diabetic nephropathy: Secondary | ICD-10-CM | POA: Diagnosis not present

## 2019-01-01 DIAGNOSIS — F419 Anxiety disorder, unspecified: Secondary | ICD-10-CM | POA: Diagnosis not present

## 2019-01-01 DIAGNOSIS — Z7984 Long term (current) use of oral hypoglycemic drugs: Secondary | ICD-10-CM | POA: Diagnosis not present

## 2019-01-01 DIAGNOSIS — R945 Abnormal results of liver function studies: Secondary | ICD-10-CM | POA: Diagnosis not present

## 2019-01-01 DIAGNOSIS — Z931 Gastrostomy status: Secondary | ICD-10-CM | POA: Diagnosis not present

## 2019-01-01 DIAGNOSIS — M6281 Muscle weakness (generalized): Secondary | ICD-10-CM | POA: Diagnosis not present

## 2019-01-01 DIAGNOSIS — R41841 Cognitive communication deficit: Secondary | ICD-10-CM | POA: Diagnosis not present

## 2019-01-01 DIAGNOSIS — R2689 Other abnormalities of gait and mobility: Secondary | ICD-10-CM | POA: Diagnosis not present

## 2019-01-01 DIAGNOSIS — Z880 Allergy status to penicillin: Secondary | ICD-10-CM | POA: Diagnosis not present

## 2019-01-01 DIAGNOSIS — R52 Pain, unspecified: Secondary | ICD-10-CM | POA: Diagnosis not present

## 2019-01-01 DIAGNOSIS — E039 Hypothyroidism, unspecified: Secondary | ICD-10-CM | POA: Diagnosis present

## 2019-01-01 DIAGNOSIS — N400 Enlarged prostate without lower urinary tract symptoms: Secondary | ICD-10-CM | POA: Diagnosis present

## 2019-01-01 DIAGNOSIS — Z85038 Personal history of other malignant neoplasm of large intestine: Secondary | ICD-10-CM | POA: Diagnosis not present

## 2019-01-01 DIAGNOSIS — J690 Pneumonitis due to inhalation of food and vomit: Secondary | ICD-10-CM | POA: Diagnosis not present

## 2019-01-01 DIAGNOSIS — Z7401 Bed confinement status: Secondary | ICD-10-CM | POA: Diagnosis not present

## 2019-01-01 DIAGNOSIS — Z87891 Personal history of nicotine dependence: Secondary | ICD-10-CM | POA: Diagnosis not present

## 2019-01-01 DIAGNOSIS — N183 Chronic kidney disease, stage 3 (moderate): Secondary | ICD-10-CM | POA: Diagnosis present

## 2019-01-01 DIAGNOSIS — F411 Generalized anxiety disorder: Secondary | ICD-10-CM | POA: Diagnosis present

## 2019-01-01 DIAGNOSIS — Z681 Body mass index (BMI) 19 or less, adult: Secondary | ICD-10-CM | POA: Diagnosis not present

## 2019-01-01 DIAGNOSIS — K529 Noninfective gastroenteritis and colitis, unspecified: Secondary | ICD-10-CM | POA: Diagnosis present

## 2019-01-01 DIAGNOSIS — N390 Urinary tract infection, site not specified: Secondary | ICD-10-CM | POA: Diagnosis not present

## 2019-01-01 DIAGNOSIS — Z8673 Personal history of transient ischemic attack (TIA), and cerebral infarction without residual deficits: Secondary | ICD-10-CM | POA: Diagnosis not present

## 2019-01-01 DIAGNOSIS — R1310 Dysphagia, unspecified: Secondary | ICD-10-CM | POA: Diagnosis present

## 2019-01-01 DIAGNOSIS — E440 Moderate protein-calorie malnutrition: Secondary | ICD-10-CM | POA: Diagnosis present

## 2019-01-01 DIAGNOSIS — R531 Weakness: Secondary | ICD-10-CM | POA: Diagnosis not present

## 2019-01-01 DIAGNOSIS — F339 Major depressive disorder, recurrent, unspecified: Secondary | ICD-10-CM | POA: Diagnosis not present

## 2019-01-01 DIAGNOSIS — R5381 Other malaise: Secondary | ICD-10-CM | POA: Diagnosis not present

## 2019-01-01 DIAGNOSIS — E1140 Type 2 diabetes mellitus with diabetic neuropathy, unspecified: Secondary | ICD-10-CM | POA: Diagnosis present

## 2019-01-01 DIAGNOSIS — Z980 Intestinal bypass and anastomosis status: Secondary | ICD-10-CM | POA: Diagnosis not present

## 2019-01-01 DIAGNOSIS — J441 Chronic obstructive pulmonary disease with (acute) exacerbation: Secondary | ICD-10-CM | POA: Diagnosis not present

## 2019-01-01 DIAGNOSIS — R05 Cough: Secondary | ICD-10-CM | POA: Diagnosis not present

## 2019-01-01 DIAGNOSIS — F329 Major depressive disorder, single episode, unspecified: Secondary | ICD-10-CM | POA: Diagnosis present

## 2019-01-01 DIAGNOSIS — K219 Gastro-esophageal reflux disease without esophagitis: Secondary | ICD-10-CM | POA: Diagnosis present

## 2019-01-01 DIAGNOSIS — B9562 Methicillin resistant Staphylococcus aureus infection as the cause of diseases classified elsewhere: Secondary | ICD-10-CM | POA: Diagnosis present

## 2019-01-01 DIAGNOSIS — R1312 Dysphagia, oropharyngeal phase: Secondary | ICD-10-CM | POA: Diagnosis not present

## 2019-01-01 DIAGNOSIS — R4182 Altered mental status, unspecified: Secondary | ICD-10-CM | POA: Diagnosis not present

## 2019-01-01 DIAGNOSIS — F0390 Unspecified dementia without behavioral disturbance: Secondary | ICD-10-CM | POA: Diagnosis present

## 2019-01-01 DIAGNOSIS — E1122 Type 2 diabetes mellitus with diabetic chronic kidney disease: Secondary | ICD-10-CM | POA: Diagnosis present

## 2019-01-08 DIAGNOSIS — F0280 Dementia in other diseases classified elsewhere without behavioral disturbance: Secondary | ICD-10-CM | POA: Diagnosis not present

## 2019-01-08 DIAGNOSIS — F339 Major depressive disorder, recurrent, unspecified: Secondary | ICD-10-CM | POA: Diagnosis not present

## 2019-01-08 DIAGNOSIS — F0390 Unspecified dementia without behavioral disturbance: Secondary | ICD-10-CM | POA: Diagnosis not present

## 2019-01-08 DIAGNOSIS — F419 Anxiety disorder, unspecified: Secondary | ICD-10-CM | POA: Diagnosis not present

## 2019-01-08 DIAGNOSIS — Z931 Gastrostomy status: Secondary | ICD-10-CM | POA: Diagnosis not present

## 2019-01-08 DIAGNOSIS — R1310 Dysphagia, unspecified: Secondary | ICD-10-CM | POA: Diagnosis not present

## 2019-01-08 DIAGNOSIS — R2689 Other abnormalities of gait and mobility: Secondary | ICD-10-CM | POA: Diagnosis not present

## 2019-01-08 DIAGNOSIS — N390 Urinary tract infection, site not specified: Secondary | ICD-10-CM | POA: Diagnosis not present

## 2019-01-08 DIAGNOSIS — R1312 Dysphagia, oropharyngeal phase: Secondary | ICD-10-CM | POA: Diagnosis not present

## 2019-01-08 DIAGNOSIS — R0602 Shortness of breath: Secondary | ICD-10-CM | POA: Diagnosis not present

## 2019-01-08 DIAGNOSIS — Z7401 Bed confinement status: Secondary | ICD-10-CM | POA: Diagnosis not present

## 2019-01-08 DIAGNOSIS — Z681 Body mass index (BMI) 19 or less, adult: Secondary | ICD-10-CM | POA: Diagnosis not present

## 2019-01-08 DIAGNOSIS — J441 Chronic obstructive pulmonary disease with (acute) exacerbation: Secondary | ICD-10-CM | POA: Diagnosis not present

## 2019-01-08 DIAGNOSIS — J690 Pneumonitis due to inhalation of food and vomit: Secondary | ICD-10-CM | POA: Diagnosis not present

## 2019-01-08 DIAGNOSIS — R5381 Other malaise: Secondary | ICD-10-CM | POA: Diagnosis not present

## 2019-01-08 DIAGNOSIS — B9562 Methicillin resistant Staphylococcus aureus infection as the cause of diseases classified elsewhere: Secondary | ICD-10-CM | POA: Diagnosis not present

## 2019-01-08 DIAGNOSIS — E440 Moderate protein-calorie malnutrition: Secondary | ICD-10-CM | POA: Diagnosis not present

## 2019-01-08 DIAGNOSIS — K210 Gastro-esophageal reflux disease with esophagitis: Secondary | ICD-10-CM | POA: Diagnosis not present

## 2019-01-08 DIAGNOSIS — R509 Fever, unspecified: Secondary | ICD-10-CM | POA: Diagnosis not present

## 2019-01-08 DIAGNOSIS — M6281 Muscle weakness (generalized): Secondary | ICD-10-CM | POA: Diagnosis not present

## 2019-01-08 DIAGNOSIS — R41841 Cognitive communication deficit: Secondary | ICD-10-CM | POA: Diagnosis not present

## 2019-01-08 DIAGNOSIS — R52 Pain, unspecified: Secondary | ICD-10-CM | POA: Diagnosis not present

## 2019-01-08 DIAGNOSIS — E1122 Type 2 diabetes mellitus with diabetic chronic kidney disease: Secondary | ICD-10-CM | POA: Diagnosis not present

## 2019-01-08 DIAGNOSIS — N183 Chronic kidney disease, stage 3 (moderate): Secondary | ICD-10-CM | POA: Diagnosis not present

## 2019-01-09 DIAGNOSIS — K210 Gastro-esophageal reflux disease with esophagitis: Secondary | ICD-10-CM | POA: Diagnosis not present

## 2019-01-09 DIAGNOSIS — R1310 Dysphagia, unspecified: Secondary | ICD-10-CM | POA: Diagnosis not present

## 2019-01-09 DIAGNOSIS — N390 Urinary tract infection, site not specified: Secondary | ICD-10-CM | POA: Diagnosis not present

## 2019-01-09 DIAGNOSIS — F0280 Dementia in other diseases classified elsewhere without behavioral disturbance: Secondary | ICD-10-CM | POA: Diagnosis not present

## 2019-01-16 ENCOUNTER — Other Ambulatory Visit: Payer: Self-pay | Admitting: *Deleted

## 2019-01-16 NOTE — Patient Outreach (Signed)
[facility]) Care Management  01/16/2019  Harry Joseph [birth year] [id number]   Member identified on [place] as being admitted to [facility].  Member was discussed during telephonic IDT meeting involving [facility] UM team and facility staff.   Facility reports disposition plans are pending member's progress with therapy.  Member screened for potential [facility] Care Management program as a benefit of his [employer]. Limited information in chart. Confirmed [name] is on [employer] All Payer's List and in [place]. However, he is not attributed to a [facility] Provider on the All Payer' List.   Will plan to further investigate if member is eligible for [place] Management program if member returns home.    Marthenia Rolling, MSN-Ed, RN,BSN [place] Acute Care Coordinator [phone]

## 2019-01-27 ENCOUNTER — Other Ambulatory Visit: Payer: Self-pay | Admitting: *Deleted

## 2019-01-27 NOTE — Patient Outreach (Signed)
[facility]) Care Management  01/26/2019  BALEN WOOLUM 1935/06/04 [id number]  Per PatientPing member expired on 02/13/2019.  Confirmed with [facility] UM RN, per facility, member expired today at [facility].  Marthenia Rolling, MSN-Ed, RN,BSN [place] Acute Care Coordinator [phone]

## 2019-02-17 DEATH — deceased
